# Patient Record
Sex: Male | Born: 1985 | Marital: Single | Smoking: Never smoker
Health system: Southern US, Community
[De-identification: ages and names within clinical notes are randomized; demographics above are authoritative.]

---

## 2015-07-25 ENCOUNTER — Ambulatory Visit (INDEPENDENT_AMBULATORY_CARE_PROVIDER_SITE_OTHER): Payer: No Typology Code available for payment source | Admitting: Sports Medicine

## 2015-07-25 ENCOUNTER — Encounter (INDEPENDENT_AMBULATORY_CARE_PROVIDER_SITE_OTHER): Payer: Self-pay | Admitting: Sports Medicine

## 2015-07-25 VITALS — BP 140/72 | HR 62 | Ht 70.0 in | Wt 194.0 lb

## 2015-07-25 DIAGNOSIS — S4352XA Sprain of left acromioclavicular joint, initial encounter: Secondary | ICD-10-CM | POA: Insufficient documentation

## 2015-07-25 DIAGNOSIS — S43102A Unspecified dislocation of left acromioclavicular joint, initial encounter: Secondary | ICD-10-CM
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
## 2020-07-30 ENCOUNTER — Emergency Department (HOSPITAL_COMMUNITY): Payer: Federal, State, Local not specified - PPO

## 2020-07-30 ENCOUNTER — Encounter (HOSPITAL_COMMUNITY): Payer: Self-pay | Admitting: Emergency Medicine

## 2020-07-30 ENCOUNTER — Other Ambulatory Visit: Payer: Self-pay

## 2020-07-30 ENCOUNTER — Emergency Department (HOSPITAL_COMMUNITY)
Admission: EM | Admit: 2020-07-30 | Discharge: 2020-07-30 | Disposition: A | Discharge status: Home / Self Care | Payer: Federal, State, Local not specified - PPO | Attending: Emergency Medicine | Admitting: Emergency Medicine

## 2020-07-30 DIAGNOSIS — N50811 Right testicular pain: Secondary | ICD-10-CM | POA: Diagnosis present

## 2020-07-30 DIAGNOSIS — N50812 Left testicular pain: Secondary | ICD-10-CM | POA: Insufficient documentation

## 2020-07-30 LAB — URINALYSIS, ROUTINE W REFLEX MICROSCOPIC
Bilirubin Urine: NEGATIVE
Glucose, UA: NEGATIVE mg/dL
Hgb urine dipstick: NEGATIVE
Ketones, ur: NEGATIVE mg/dL
Leukocytes,Ua: NEGATIVE
Nitrite: NEGATIVE
Protein, ur: NEGATIVE mg/dL
Specific Gravity, Urine: 1.019 (ref 1.005–1.030)
pH: 7 (ref 5.0–8.0)

## 2020-07-30 NOTE — ED Notes (Signed)
Pt d/c by  MD and is provided w/ d.c instructions and follow up care, pt out of ED ambulatory  

## 2020-07-30 NOTE — ED Provider Notes (Signed)
[facility] EMERGENCY DEPARTMENT Provider Note   CSN: [id number] Arrival date & time: 07/30/20  1530     History Chief Complaint  Patient presents with  . Testicle Pain    Thomas Sutton is a 35 y.o. male.  Patient presents to ER chief complaint of bilateral testicle pain.  Initially it was the left testicle which has been hurting for about a week.  He saw his primary care doctor who ordered an ultrasound which was performed 3 days ago and was unremarkable.  Patient states that the pain is down to the right testicle as well and is persistent for the past 24 hours now.  Describes it as sharp and aching worse when he moves a certain way or when he walks.  Denies fevers or cough or vomiting or diarrhea.  Denies any penile discharge.  No history of STD or other infection.        History reviewed. No pertinent past medical history.  There are no problems to display for this patient.   History reviewed. No pertinent surgical history.     No family history on file.  Social History   Tobacco Use  . Smoking status: Never Smoker  . Smokeless tobacco: Never Used  Substance Use Topics  . Alcohol use: Yes  . Drug use: Not Currently    Home Medications Prior to Admission medications   Not on File    Allergies    Codeine  Review of Systems   Review of Systems  Constitutional: Negative for fever.  HENT: Negative for ear pain and sore throat.   Eyes: Negative for pain.  Respiratory: Negative for cough.   Cardiovascular: Negative for chest pain.  Gastrointestinal: Negative for abdominal pain.  Genitourinary: Negative for flank pain.  Musculoskeletal: Negative for back pain.  Skin: Negative for color change and rash.  Neurological: Negative for syncope.  All other systems reviewed and are negative.   Physical Exam Updated Vital Signs BP 122/70   Pulse (!) 56   Temp 98.2 F (36.8 C) (Oral)   Resp 19   SpO2 95%   Physical Exam Vitals and nursing  note reviewed.  Constitutional:      Appearance: He is well-developed.  HENT:     Head: Normocephalic and atraumatic.  Eyes:     Conjunctiva/sclera: Conjunctivae normal.  Cardiovascular:     Rate and Rhythm: Normal rate and regular rhythm.     Heart sounds: No murmur heard.   Pulmonary:     Effort: Pulmonary effort is normal. No respiratory distress.     Breath sounds: Normal breath sounds.  Abdominal:     Palpations: Abdomen is soft.     Tenderness: There is no abdominal tenderness.  Genitourinary:    Comments: No penile lesions seen on visual exam.  Cremasteric reflex intact bilaterally.  No scrotal cellulitis or erythema or other lesion noted.  Testicular lie appears normal.  Moderate tenderness on bilateral testicles on palpation. Musculoskeletal:     Cervical back: Neck supple.  Skin:    General: Skin is warm and dry.  Neurological:     Mental Status: He is alert.     ED Results / Procedures / Treatments   Labs (all labs ordered are listed, but only abnormal results are displayed) Labs Reviewed  URINALYSIS, ROUTINE W REFLEX MICROSCOPIC    EKG None  Radiology US SCROTUM W/DOPPLER  Result Date: 07/30/2020 CLINICAL DATA:  Bilateral testicular pain EXAM: SCROTAL ULTRASOUND DOPPLER ULTRASOUND OF THE TESTICLES TECHNIQUE:  Complete ultrasound examination of the testicles, epididymis, and other scrotal structures was performed. Color and spectral Doppler ultrasound were also utilized to evaluate blood flow to the testicles. COMPARISON:  None. FINDINGS: Right testicle Measurements: 5.0 x 2.8 x 2.9 cm. No mass or microlithiasis visualized. Left testicle Measurements: 5.1 x 2.7 x 2.9 cm. No mass or microlithiasis visualized. Right epididymis:  Normal in size and appearance. Left epididymis:  Normal in size and appearance. Hydrocele:  None visualized. Varicocele:  Small left varicocele. Pulsed Doppler interrogation of both testes demonstrates normal low resistance arterial and  venous waveforms bilaterally. IMPRESSION: 1. Small left varicocele. 2. No other abnormalities identified. Electronically Signed   By: Gerome Sam III M.D   On: 07/30/2020 17:33    Procedures Procedures   Medications Ordered in ED Medications - No data to display  ED Course  I have reviewed the triage vital signs and the nursing notes.  Pertinent labs & imaging results that were available during my care of the patient were reviewed by me and considered in my medical decision making (see chart for details).    MDM Rules/Calculators/A&P                          Urinalysis unremarkable.  Patient declined pain medications at this time.  Ultrasound of the testicle ordered again.  Work-up today shows no evidence of torsion.  Ultrasound otherwise unremarkable.  Possibility of torsion detorsion considered, however ultrasound was performed during patient's symptoms and given negative work-up I feel this is unlikely to be torsion.  Will recommend outpatient follow-up with urology within 2 to 3 days, advised the patient to call tomorrow to make an appointment.  Advising immediate return for worsening pain fevers or any additional concerns.   Final Clinical Impression(s) / ED Diagnoses Final diagnoses:  Pain in both testicles    Rx / DC Orders ED Discharge Orders    None       Cheryll Cockayne, MD 07/30/20 1815

## 2020-07-30 NOTE — Discharge Instructions (Addendum)
Call your primary care doctor or specialist as discussed in the next 2-3 days.   Return immediately back to the ER if:  Your symptoms worsen within the next 12-24 hours. You develop new symptoms such as new fevers, persistent vomiting, new pain, shortness of breath, or new weakness or numbness, or if you have any other concerns.  

## 2020-07-30 NOTE — ED Triage Notes (Signed)
C/o bilateral testicle pain x 3 weeks.  Seen by PCP on Friday and had [place].  Also reports pain after urinating.

## 2021-05-01 IMAGING — US US SCROTUM W/ DOPPLER COMPLETE
1 series · 14 of 25 positions shown · non-contrast
Comparison: None.

CLINICAL DATA: Bilateral testicular pain

EXAM:
SCROTAL ULTRASOUND
DOPPLER ULTRASOUND OF THE TESTICLES
TECHNIQUE: Complete ultrasound examination of the testicles, epididymis, and
other scrotal structures was performed. Color and spectral Doppler
ultrasound were also utilized to evaluate blood flow to the
testicles.

[Series 1: us scrotum w/doppler · 14 of 86 slices shown]
[im 1/86]
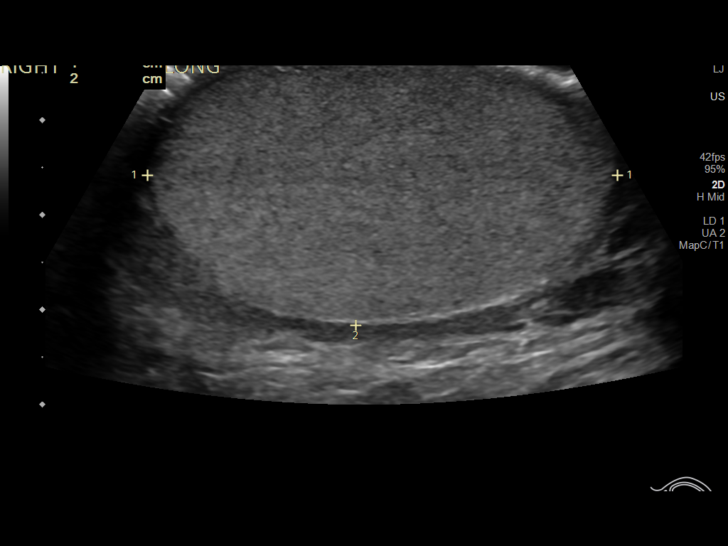
[im 8/86]
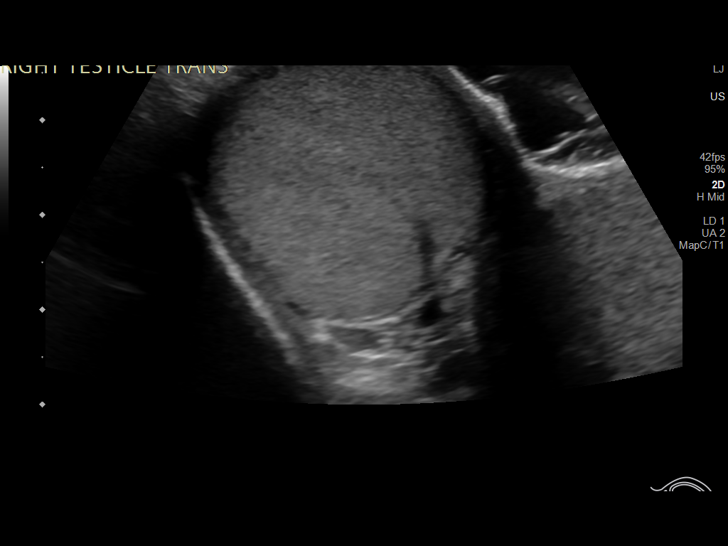
[im 15/86]
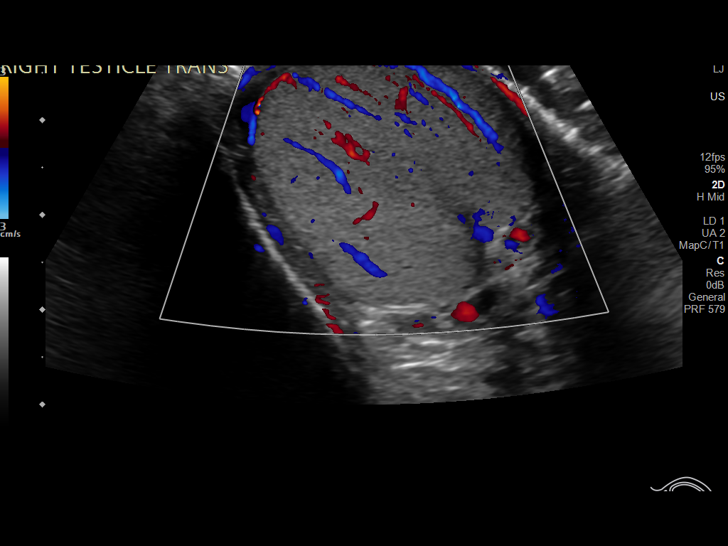
[im 22/86]
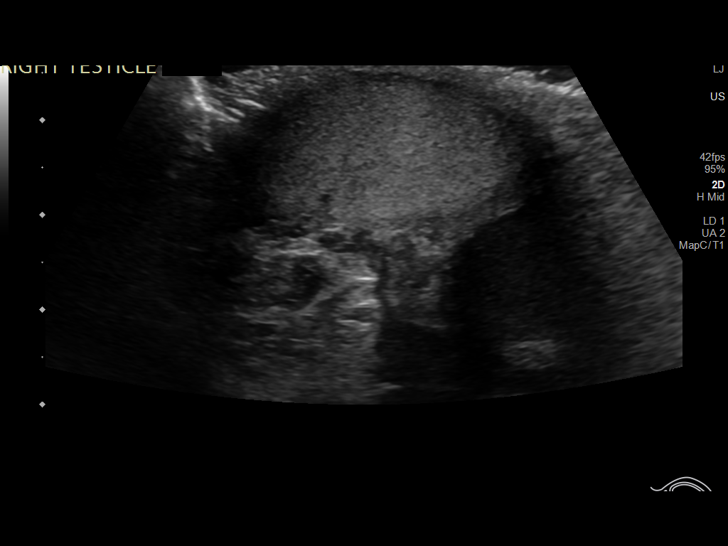
[im 29/86]
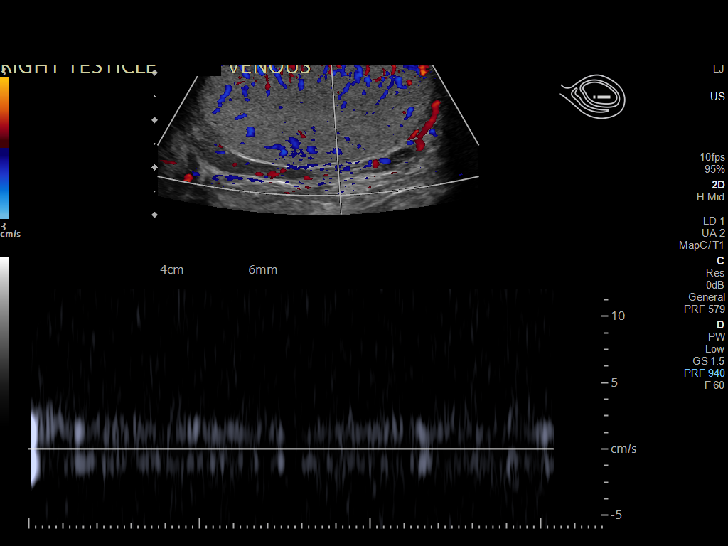
[im 32/86]
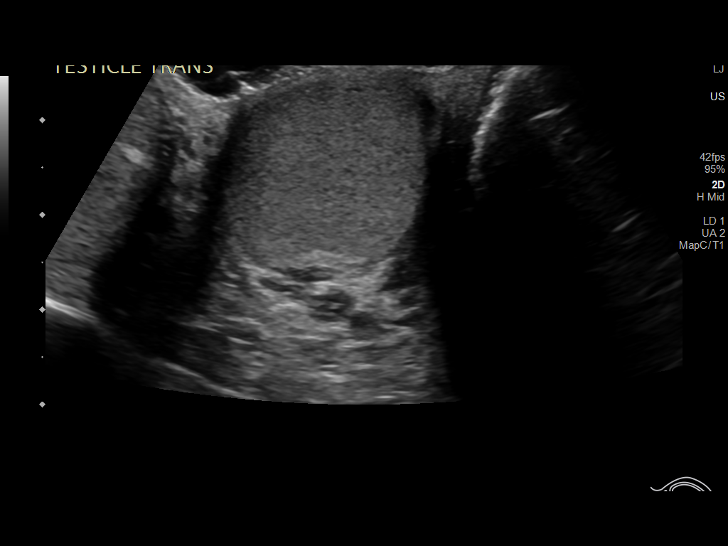
[im 39/86]
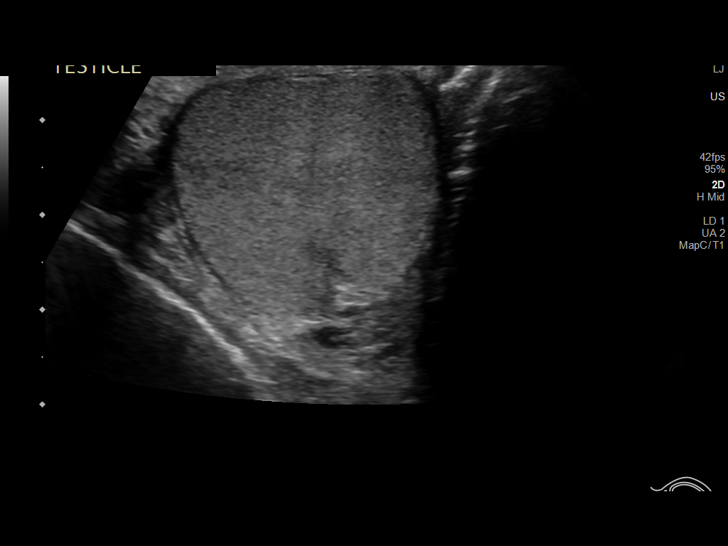
[im 47/86]
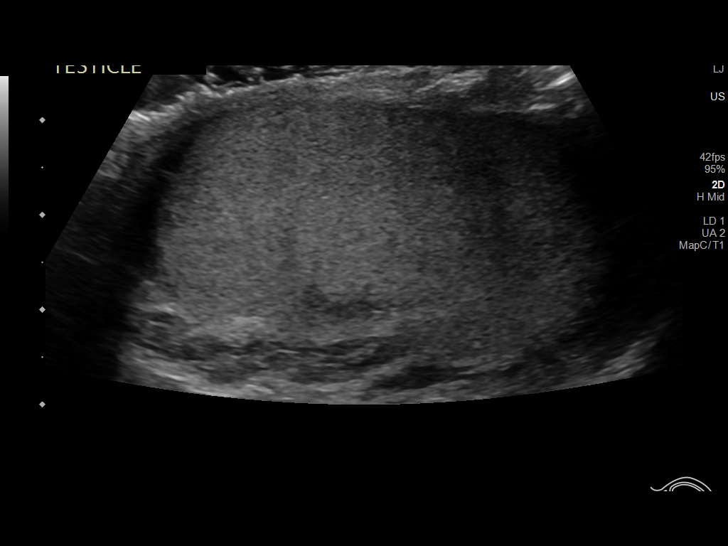
[im 54/86]
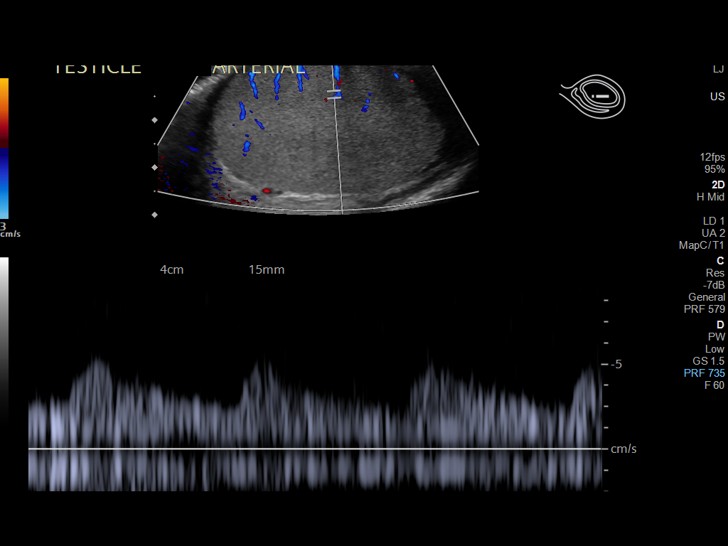
[im 57/86]
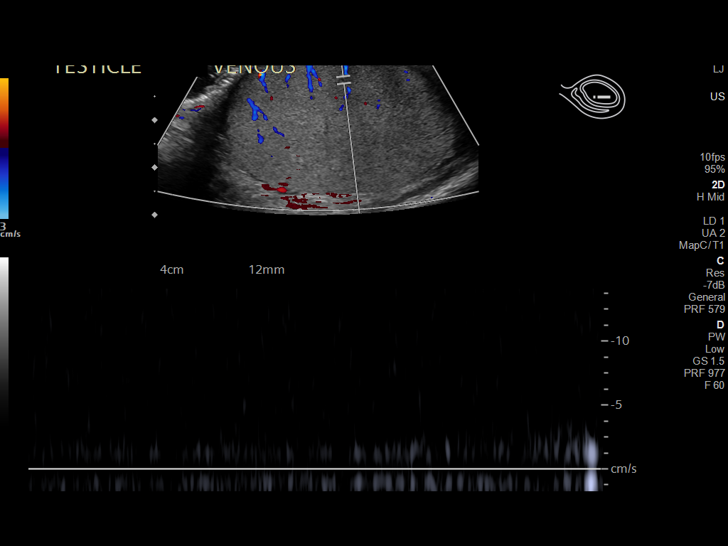
[im 64/86]
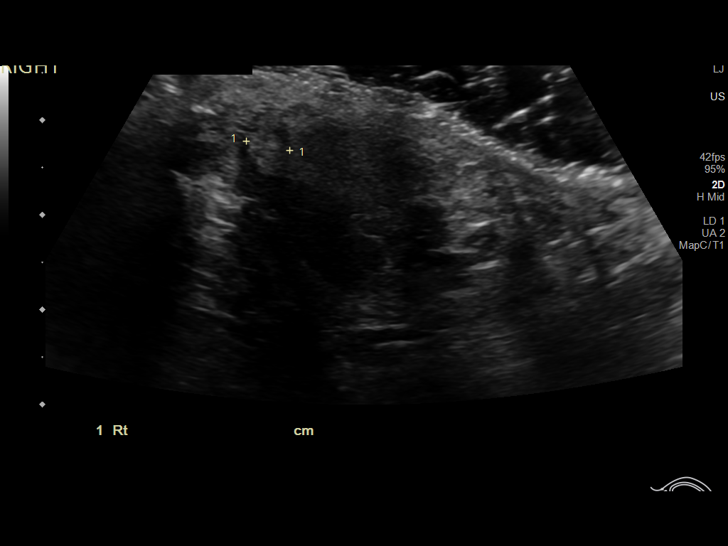
[im 71/86]
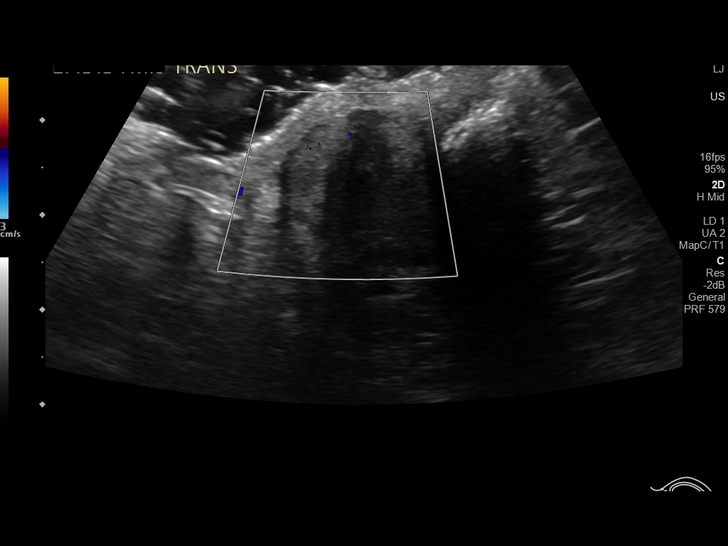
[im 78/86]
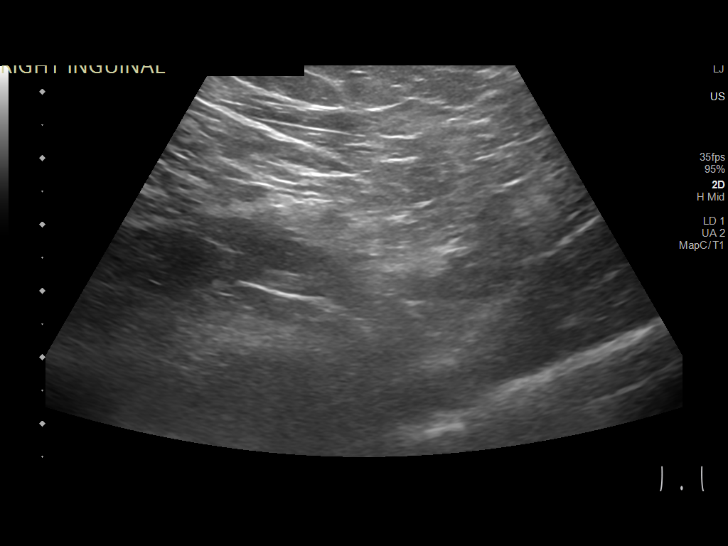
[im 86/86]
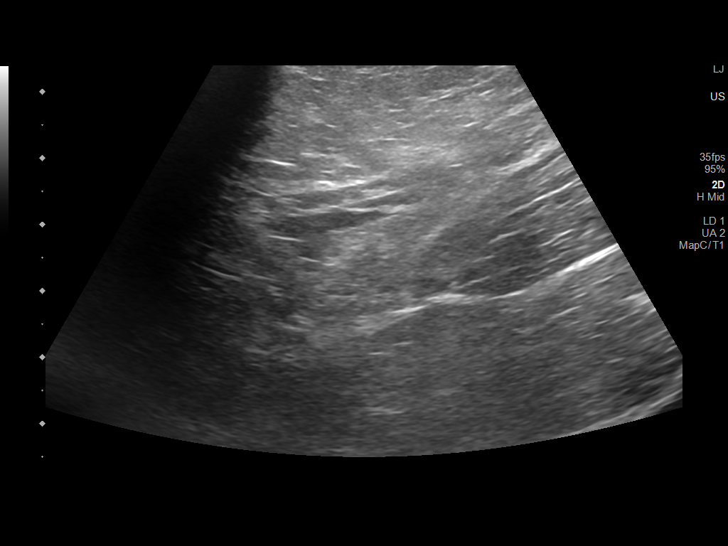

[14 of 25 positions shown; findings below may reference images not displayed]

FINDINGS: Right testicle

Measurements: 5.0 x 2.8 x 2.9 cm. No mass or microlithiasis
visualized.

Left testicle

Measurements: 5.1 x 2.7 x 2.9 cm. No mass or microlithiasis
visualized.

Right epididymis:  Normal in size and appearance.

Left epididymis:  Normal in size and appearance.

Hydrocele:  None visualized.

Varicocele:  Small left varicocele.

Pulsed Doppler interrogation of both testes demonstrates normal low
resistance arterial and venous waveforms bilaterally.
IMPRESSION: 1. Small left varicocele.
2. No other abnormalities identified.
